# Patient Record
Sex: Female | Born: 1990 | Race: White | Hispanic: No | Marital: Single | Smoking: Never smoker
Health system: Southern US, Community
[De-identification: ages and names within clinical notes are randomized; demographics above are authoritative.]

## PROBLEM LIST (undated history)

## (undated) DIAGNOSIS — L309 Dermatitis, unspecified: Secondary | ICD-10-CM

## (undated) DIAGNOSIS — F419 Anxiety disorder, unspecified: Secondary | ICD-10-CM

## (undated) HISTORY — PX: OTHER SURGICAL HISTORY: SHX169

---

## 2017-02-05 ENCOUNTER — Other Ambulatory Visit: Payer: Self-pay | Admitting: Internal Medicine

## 2017-02-05 DIAGNOSIS — K8050 Calculus of bile duct without cholangitis or cholecystitis without obstruction: Secondary | ICD-10-CM

## 2017-02-06 ENCOUNTER — Ambulatory Visit
Admission: RE | Admit: 2017-02-06 | Discharge: 2017-02-06 | Disposition: A | Discharge status: Home / Self Care | Payer: BLUE CROSS/BLUE SHIELD | Source: Ambulatory Visit | Attending: Internal Medicine | Admitting: Internal Medicine

## 2017-02-06 DIAGNOSIS — K8050 Calculus of bile duct without cholangitis or cholecystitis without obstruction: Secondary | ICD-10-CM

## 2017-02-12 ENCOUNTER — Ambulatory Visit: Payer: Self-pay | Admitting: Surgery

## 2017-02-12 NOTE — H&P (Signed)
[facility]12[facility] 9:06 AM Location: [facility] Patient #: [id number] DOB: 12/06/1990 Undefined / Language: [place] / Race: White Female  History of Present Illness (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 No associated fevers or diarrhea. She still having some mild pain and nausea but is able to tolerate a bland diet. She underwent an ultrasound that did show gallstones. This was on June 22 at that time her gallbladder wall was thickened 4-5 mm with a positive sonographic Murphy's sign. There was no pericholecystic fluid and the common bile duct was 3 mm. She had a CMP, CBC and amylase all of which were unremarkable. She had exploratory surgery as a child but it sounds like it was examined under anesthesia she has no abdominal scars. She does not smoke, use alcohol or other drugs.  The patient is a 26 year old female.   Diagnostic Studies History Brandy Castillo([name], [place]; 02/12/2017 9:06 AM) Colonoscopy never Mammogram never Pap Smear >5 years ago  Allergies Brandy Castillo([name], RMA; 02/12/2017 9:08 AM) No Known Drug Allergies 02/12/2017  Medication History Brandy Castillo([name], RMA; 02/12/2017 9:08 AM) Claritin (10MG  Tablet, Oral) Active. Medications Reconciled  Social History Brandy Castillo([name], [place]; 02/12/2017 9:06 AM) Alcohol use Occasional alcohol use. Caffeine use Carbonated beverages, Coffee. No drug use Tobacco use Never smoker.  Family History Brandy Castillo([name], [place]; 02/12/2017 9:06 AM) Arthritis Father. Breast Cancer Family Members In General. Diabetes Mellitus Mother. Heart Disease Father. Hypertension Father. Melanoma Family Members In General. Migraine Headache Mother, Sister.  Pregnancy / Birth History Brandy Castillo(Christen  Lambert, [place]; 02/12/2017 9:06 AM) Age at menarche 12 years. Contraceptive History Depo-provera, Oral contraceptives. Gravida 0 Regular periods  Other Problems Brandy Castillo([name], RMA; 02/12/2017 9:06 AM) Cholelithiasis     Review of Systems ([name] MD; 02/12/2017 9:57 AM) General Present- Appetite Loss and Fatigue. Not Present- Chills, Fever, Night Sweats, Weight Gain and Weight Loss. Skin Not Present- Change in Wart/Mole, Dryness, Hives, Jaundice, New Lesions, Non-Healing Wounds, Rash and Ulcer. HEENT Present- Seasonal Allergies. Not Present- Earache, Hearing Loss, Hoarseness, Nose Bleed, Oral Ulcers, Ringing in the Ears, Sinus Pain, Sore Throat, Visual Disturbances, Wears glasses/contact lenses and Yellow Eyes. Respiratory Not Present- Bloody sputum, Chronic Cough, Difficulty Breathing, Snoring and Wheezing. Breast Not Present- Breast Mass, Breast Pain, Nipple Discharge and Skin Changes. Cardiovascular Not Present- Chest Pain, Difficulty Breathing Lying Down, Leg Cramps, Palpitations, Rapid Heart Rate, Shortness of Breath and Swelling of Extremities. Gastrointestinal Present- Abdominal Pain, Bloating, Change in Bowel Habits and Gets full quickly at meals. Not Present- Bloody Stool, Chronic diarrhea, Constipation, Difficulty Swallowing, Excessive gas, Hemorrhoids, Indigestion, Nausea, Rectal Pain and Vomiting. Female Genitourinary Not Present- Frequency, Nocturia, Painful Urination, Pelvic Pain and Urgency. Musculoskeletal Present- Back Pain. Not Present- Joint Pain, Joint Stiffness, Muscle Pain, Muscle Weakness and Swelling of Extremities. Neurological Not Present- Decreased Memory, Fainting, Headaches, Numbness, Seizures, Tingling, Tremor, Trouble walking and Weakness. Psychiatric Present- Change in Sleep Pattern. Not Present- Anxiety, Bipolar, Depression, Fearful and Frequent crying. Endocrine Not Present- Cold Intolerance, Excessive Hunger, Hair Changes, Heat Intolerance, Hot  flashes and New Diabetes. Hematology Not Present- Blood Thinners, Easy Bruising, Excessive bleeding, Gland problems, HIV and Persistent Infections. All other systems negative  Vitals Brandy Castillo([name] RMA; 02/12/2017 9:08 AM) 02/12/2017 9:07 AM Weight: 239.8 lb Height: 66.5in Body Surface Area: 2.17 m  Body Mass Index: 38.12 kg/m  Temp.: 98.46F  Pulse: 111 (Regular)  P.OX: 99% (Room air) BP: 158/100 (Sitting, Left Arm, Standard)      Physical Exam (Chelsea A. Connor MD; 02/12/2017 9:58 AM)  General Note: She is alert and oriented, no distress  Integumentary Note: Skin is warm and dry  Head and Neck Note: No mass or thyromegaly  Eye Note: Anicteric, extraocular motions intact, pupils equally round and reactive  Chest and Lung Exam Note: Unlabored respirations, symmetrical air entry. No tactile fremitus  Cardiovascular Note: Regular rate and rhythm. Palpable pedal pulses, no lower extremity edema.  Abdomen Note: Soft, nondistended. No palpable mass or organomegaly. Minimal tenderness in the right upper quadrant  Neurologic Note: Grossly intact, normal gait oriented 3  Neuropsychiatric Note: Normal mood and affect, appropriate insight  Musculoskeletal Note: Strength symmetrical throughout, no deformity    Assessment & Plan (Chelsea A. Connor MD; 02/12/2017 9:59 AM)  CHOLECYSTITIS (K81.9) Story: Normal labs, afebrile but inflammatory findings noted on ultrasound. I do recommend flaps And cholecystectomy. I discussed with her first of all symptoms that should prompt her to go to the ER including vomiting, fever, intractable pain and jaundice. I discussed with her laparoscopic surgery, very low risk of conversion to open, low risk of infection, bleeding, scarring, intra-abdominal injury specific lesion of the common bile duct and sequelae. She is already done some reading on her own and understood our conversation very well. She had several insightful  questions WERE answered. We'll try to get her scheduled as soon as possible.  Current Plans Started Hydrocodone-Acetaminophen 5-325MG , 1 (one) Tablet three times daily, as needed, #15, 5 days starting 02/12/2017, No Refill.

## 2017-02-16 ENCOUNTER — Encounter (HOSPITAL_COMMUNITY): Payer: Self-pay | Admitting: *Deleted

## 2017-02-16 NOTE — Progress Notes (Signed)
Ms Brandy Castillo denies chest pain or discomfort.  Patient reports that she has has palpations in the past , it was with anxiety- last time was in 2016.

## 2017-02-17 ENCOUNTER — Ambulatory Visit (HOSPITAL_COMMUNITY): Payer: BLUE CROSS/BLUE SHIELD | Admitting: Anesthesiology

## 2017-02-17 ENCOUNTER — Ambulatory Visit (HOSPITAL_COMMUNITY)
Admission: RE | Admit: 2017-02-17 | Discharge: 2017-02-17 | Disposition: A | Discharge status: Home / Self Care | Payer: BLUE CROSS/BLUE SHIELD | Source: Ambulatory Visit | Attending: Surgery | Admitting: Surgery

## 2017-02-17 ENCOUNTER — Encounter (HOSPITAL_COMMUNITY): Payer: Self-pay | Admitting: *Deleted

## 2017-02-17 ENCOUNTER — Encounter (HOSPITAL_COMMUNITY)
Admission: RE | Disposition: A | Discharge status: Home / Self Care | Payer: Self-pay | Source: Ambulatory Visit | Attending: Surgery

## 2017-02-17 DIAGNOSIS — Z6838 Body mass index (BMI) 38.0-38.9, adult: Secondary | ICD-10-CM | POA: Insufficient documentation

## 2017-02-17 DIAGNOSIS — K8000 Calculus of gallbladder with acute cholecystitis without obstruction: Secondary | ICD-10-CM | POA: Diagnosis not present

## 2017-02-17 DIAGNOSIS — K810 Acute cholecystitis: Secondary | ICD-10-CM | POA: Diagnosis present

## 2017-02-17 HISTORY — DX: Anxiety disorder, unspecified: F41.9

## 2017-02-17 HISTORY — DX: Dermatitis, unspecified: L30.9

## 2017-02-17 HISTORY — PX: CHOLECYSTECTOMY: SHX55

## 2017-02-17 LAB — CBC
HCT: 41.8 % (ref 36.0–46.0)
HEMOGLOBIN: 13.8 g/dL (ref 12.0–15.0)
MCH: 29.1 pg (ref 26.0–34.0)
MCHC: 33 g/dL (ref 30.0–36.0)
MCV: 88.2 fL (ref 78.0–100.0)
Platelets: 291 10*3/uL (ref 150–400)
RBC: 4.74 MIL/uL (ref 3.87–5.11)
RDW: 12.8 % (ref 11.5–15.5)
WBC: 4.2 10*3/uL (ref 4.0–10.5)

## 2017-02-17 LAB — HCG, SERUM, QUALITATIVE: PREG SERUM: NEGATIVE

## 2017-02-17 SURGERY — LAPAROSCOPIC CHOLECYSTECTOMY
Anesthesia: General | Site: Abdomen

## 2017-02-17 MED ORDER — ACETAMINOPHEN 500 MG PO TABS
ORAL_TABLET | ORAL | Status: AC
  Filled 2017-02-17: qty 2

## 2017-02-17 MED ORDER — 0.9 % SODIUM CHLORIDE (POUR BTL) OPTIME
TOPICAL | Status: DC | PRN
  Administered 2017-02-17: 1000 mL

## 2017-02-17 MED ORDER — MIDAZOLAM HCL 2 MG/2ML IJ SOLN
INTRAMUSCULAR | Status: AC
  Filled 2017-02-17: qty 2

## 2017-02-17 MED ORDER — DEXAMETHASONE SODIUM PHOSPHATE 10 MG/ML IJ SOLN
INTRAMUSCULAR | Status: DC | PRN
  Administered 2017-02-17: 10 mg via INTRAVENOUS

## 2017-02-17 MED ORDER — LACTATED RINGERS IV SOLN
INTRAVENOUS | Status: DC
  Administered 2017-02-17: 13:00:00 via INTRAVENOUS

## 2017-02-17 MED ORDER — BUPIVACAINE-EPINEPHRINE 0.25% -1:200000 IJ SOLN
INTRAMUSCULAR | Status: DC | PRN
  Administered 2017-02-17: 20 mL

## 2017-02-17 MED ORDER — CELECOXIB 200 MG PO CAPS
ORAL_CAPSULE | ORAL | Status: AC
  Filled 2017-02-17: qty 2

## 2017-02-17 MED ORDER — FENTANYL CITRATE (PF) 100 MCG/2ML IJ SOLN
INTRAMUSCULAR | Status: DC | PRN
  Administered 2017-02-17 (×2): 50 ug via INTRAVENOUS
  Administered 2017-02-17 (×3): 100 ug via INTRAVENOUS

## 2017-02-17 MED ORDER — SUGAMMADEX SODIUM 200 MG/2ML IV SOLN
INTRAVENOUS | Status: DC | PRN
  Administered 2017-02-17: 200 mg via INTRAVENOUS

## 2017-02-17 MED ORDER — CEFAZOLIN SODIUM-DEXTROSE 2-4 GM/100ML-% IV SOLN
INTRAVENOUS | Status: AC
  Filled 2017-02-17: qty 100

## 2017-02-17 MED ORDER — CEFAZOLIN SODIUM-DEXTROSE 2-4 GM/100ML-% IV SOLN
2.0000 g | INTRAVENOUS | Status: AC
  Administered 2017-02-17: 2 g via INTRAVENOUS

## 2017-02-17 MED ORDER — FENTANYL CITRATE (PF) 100 MCG/2ML IJ SOLN
25.0000 ug | INTRAMUSCULAR | Status: DC | PRN
  Administered 2017-02-17 (×2): 50 ug via INTRAVENOUS

## 2017-02-17 MED ORDER — ONDANSETRON HCL 4 MG/2ML IJ SOLN
INTRAMUSCULAR | Status: DC | PRN
  Administered 2017-02-17: 4 mg via INTRAVENOUS

## 2017-02-17 MED ORDER — CHLORHEXIDINE GLUCONATE 4 % EX LIQD: 60.0000 mL | Freq: Once | CUTANEOUS | Status: DC

## 2017-02-17 MED ORDER — ROCURONIUM BROMIDE 100 MG/10ML IV SOLN
INTRAVENOUS | Status: DC | PRN
  Administered 2017-02-17: 60 mg via INTRAVENOUS

## 2017-02-17 MED ORDER — FENTANYL CITRATE (PF) 250 MCG/5ML IJ SOLN
INTRAMUSCULAR | Status: AC
  Filled 2017-02-17: qty 5

## 2017-02-17 MED ORDER — LACTATED RINGERS IV SOLN
INTRAVENOUS | Status: DC | PRN
  Administered 2017-02-17 (×2): via INTRAVENOUS

## 2017-02-17 MED ORDER — DOCUSATE SODIUM 100 MG PO CAPS: 100.0000 mg | ORAL_CAPSULE | Freq: Two times a day (BID) | ORAL | 0 refills | Status: AC

## 2017-02-17 MED ORDER — ACETAMINOPHEN 500 MG PO TABS
1000.0000 mg | ORAL_TABLET | ORAL | Status: AC
  Administered 2017-02-17: 1000 mg via ORAL

## 2017-02-17 MED ORDER — MIDAZOLAM HCL 5 MG/5ML IJ SOLN
INTRAMUSCULAR | Status: DC | PRN
  Administered 2017-02-17: 2 mg via INTRAVENOUS

## 2017-02-17 MED ORDER — OXYCODONE HCL 5 MG PO TABS: 5.0000 mg | ORAL_TABLET | Freq: Once | ORAL | Status: DC | PRN

## 2017-02-17 MED ORDER — BUPIVACAINE-EPINEPHRINE (PF) 0.25% -1:200000 IJ SOLN
INTRAMUSCULAR | Status: AC
  Filled 2017-02-17: qty 30

## 2017-02-17 MED ORDER — FENTANYL CITRATE (PF) 100 MCG/2ML IJ SOLN
INTRAMUSCULAR | Status: AC
  Administered 2017-02-17: 50 ug via INTRAVENOUS
  Filled 2017-02-17: qty 2

## 2017-02-17 MED ORDER — OXYCODONE HCL 5 MG PO TABS: 5.0000 mg | ORAL_TABLET | ORAL | Status: DC | PRN

## 2017-02-17 MED ORDER — OXYCODONE-ACETAMINOPHEN 5-325 MG PO TABS: 1.0000 | ORAL_TABLET | Freq: Four times a day (QID) | ORAL | 0 refills | Status: AC | PRN

## 2017-02-17 MED ORDER — GABAPENTIN 300 MG PO CAPS
300.0000 mg | ORAL_CAPSULE | ORAL | Status: AC
  Administered 2017-02-17: 300 mg via ORAL

## 2017-02-17 MED ORDER — PROPOFOL 10 MG/ML IV BOLUS
INTRAVENOUS | Status: AC
  Filled 2017-02-17: qty 20

## 2017-02-17 MED ORDER — OXYCODONE HCL 5 MG/5ML PO SOLN: 5.0000 mg | Freq: Once | ORAL | Status: DC | PRN

## 2017-02-17 MED ORDER — PROPOFOL 10 MG/ML IV BOLUS
INTRAVENOUS | Status: DC | PRN
  Administered 2017-02-17: 30 mg via INTRAVENOUS
  Administered 2017-02-17: 100 mg via INTRAVENOUS
  Administered 2017-02-17: 130 mg via INTRAVENOUS
  Administered 2017-02-17: 40 mg via INTRAVENOUS

## 2017-02-17 MED ORDER — CELECOXIB 200 MG PO CAPS
400.0000 mg | ORAL_CAPSULE | ORAL | Status: AC
  Administered 2017-02-17: 400 mg via ORAL

## 2017-02-17 MED ORDER — SODIUM CHLORIDE 0.9 % IR SOLN
Status: DC | PRN
  Administered 2017-02-17: 1

## 2017-02-17 MED ORDER — GABAPENTIN 300 MG PO CAPS
ORAL_CAPSULE | ORAL | Status: AC
  Filled 2017-02-17: qty 1

## 2017-02-17 SURGICAL SUPPLY — 38 items
APPLIER CLIP 5 13 M/L LIGAMAX5 (MISCELLANEOUS) ×3
BLADE CLIPPER SURG (BLADE) IMPLANT
CANISTER SUCT 3000ML PPV (MISCELLANEOUS) ×3 IMPLANT
CHLORAPREP W/TINT 26ML (MISCELLANEOUS) ×3 IMPLANT
CLIP APPLIE 5 13 M/L LIGAMAX5 (MISCELLANEOUS) ×1 IMPLANT
COVER SURGICAL LIGHT HANDLE (MISCELLANEOUS) ×3 IMPLANT
DERMABOND ADVANCED (GAUZE/BANDAGES/DRESSINGS) ×2
DERMABOND ADVANCED .7 DNX12 (GAUZE/BANDAGES/DRESSINGS) ×1 IMPLANT
ELECT REM PT RETURN 9FT ADLT (ELECTROSURGICAL) ×3
ELECTRODE REM PT RTRN 9FT ADLT (ELECTROSURGICAL) ×1 IMPLANT
GLOVE BIO SURGEON STRL SZ 6 (GLOVE) ×3 IMPLANT
GLOVE BIOGEL PI IND STRL 6 (GLOVE) ×1 IMPLANT
GLOVE BIOGEL PI IND STRL 6.5 (GLOVE) ×2 IMPLANT
GLOVE BIOGEL PI IND STRL 7.0 (GLOVE) ×2 IMPLANT
GLOVE BIOGEL PI INDICATOR 6 (GLOVE) ×2
GLOVE BIOGEL PI INDICATOR 6.5 (GLOVE) ×4
GLOVE BIOGEL PI INDICATOR 7.0 (GLOVE) ×4
GLOVE SURG SS PI 7.0 STRL IVOR (GLOVE) ×3 IMPLANT
GOWN STRL REUS W/ TWL LRG LVL3 (GOWN DISPOSABLE) ×3 IMPLANT
GOWN STRL REUS W/TWL LRG LVL3 (GOWN DISPOSABLE) ×6
GRASPER SUT TROCAR 14GX15 (MISCELLANEOUS) ×3 IMPLANT
KIT BASIN OR (CUSTOM PROCEDURE TRAY) ×3 IMPLANT
KIT ROOM TURNOVER OR (KITS) ×3 IMPLANT
NEEDLE INSUFFLATION 14GA 120MM (NEEDLE) ×3 IMPLANT
NS IRRIG 1000ML POUR BTL (IV SOLUTION) ×3 IMPLANT
PAD ARMBOARD 7.5X6 YLW CONV (MISCELLANEOUS) ×3 IMPLANT
POUCH SPECIMEN RETRIEVAL 10MM (ENDOMECHANICALS) ×3 IMPLANT
SCISSORS LAP 5X35 DISP (ENDOMECHANICALS) ×3 IMPLANT
SET IRRIG TUBING LAPAROSCOPIC (IRRIGATION / IRRIGATOR) ×3 IMPLANT
SLEEVE ENDOPATH XCEL 5M (ENDOMECHANICALS) ×6 IMPLANT
SPECIMEN JAR SMALL (MISCELLANEOUS) ×3 IMPLANT
SUT MNCRL AB 4-0 PS2 18 (SUTURE) ×3 IMPLANT
TOWEL OR 17X24 6PK STRL BLUE (TOWEL DISPOSABLE) ×3 IMPLANT
TOWEL OR 17X26 10 PK STRL BLUE (TOWEL DISPOSABLE) IMPLANT
TRAY LAPAROSCOPIC MC (CUSTOM PROCEDURE TRAY) ×3 IMPLANT
TROCAR XCEL NON-BLD 11X100MML (ENDOMECHANICALS) ×3 IMPLANT
TROCAR XCEL NON-BLD 5MMX100MML (ENDOMECHANICALS) ×3 IMPLANT
TUBING INSUFFLATION (TUBING) ×3 IMPLANT

## 2017-02-17 NOTE — Interval H&P Note (Signed)
History and Physical Interval Note:  02/17/2017 12:48 PM  Brandy Castillo  has presented today for surgery, with the diagnosis of cholecystitis  The various methods of treatment have been discussed with the patient and family. After consideration of risks, benefits and other options for treatment, the patient has consented to  Procedure(s): LAPAROSCOPIC CHOLECYSTECTOMY (N/A) as a surgical intervention .  The patient's history has been reviewed, patient examined, no change in status, stable for surgery.  I have reviewed the patient's chart and labs.  Questions were answered to the patient's satisfaction.     Yuvia Plant Lollie SailsA Cary Wilford

## 2017-02-17 NOTE — Discharge Instructions (Signed)
LAPAROSCOPIC SURGERY: POST OP INSTRUCTIONS ° °###################################################################### ° °EAT °Gradually transition to a high fiber diet with a fiber supplement over the next few weeks after discharge.  Start with a pureed / full liquid diet (see below) ° °WALK °Walk an hour a day.  Control your pain to do that.   ° °CONTROL PAIN °Control pain so that you can walk, sleep, tolerate sneezing/coughing, go up/down stairs. ° °HAVE A BOWEL MOVEMENT DAILY °Keep your bowels regular to avoid problems.  OK to try a laxative to override constipation.  OK to use an antidairrheal to slow down diarrhea.  Call if not better after 2 tries ° °CALL IF YOU HAVE PROBLEMS/CONCERNS °Call if you are still struggling despite following these instructions. °Call if you have concerns not answered by these instructions ° °###################################################################### ° ° ° °1. DIET: Follow a light bland diet the first 24 hours after arrival home, such as soup, liquids, crackers, etc.  Be sure to include lots of fluids daily.  Avoid fast food or heavy meals as your are more likely to get nauseated.  Eat a low fat the next few days after surgery.   °2. Take your usually prescribed home medications unless otherwise directed. °3. PAIN CONTROL: °a. Pain is best controlled by a usual combination of three different methods TOGETHER: °i. Ice/Heat °ii. Over the counter pain medication °iii. Prescription pain medication °b. Most patients will experience some swelling and bruising around the incisions.  Ice packs or heating pads (30-60 minutes up to 6 times a day) will help. Use ice for the first few days to help decrease swelling and bruising, then switch to heat to help relax tight/sore spots and speed recovery.  Some people prefer to use ice alone, heat alone, alternating between ice & heat.  Experiment to what works for you.  Swelling and bruising can take several weeks to resolve.   °c. It is  helpful to take an over-the-counter pain medication regularly for the first few weeks.  Choose one of the following that works best for you: °i. Naproxen (Aleve, etc)  Two 220mg tabs twice a day °ii. Ibuprofen (Advil, etc) Three 200mg tabs four times a day (every meal & bedtime) °iii. Acetaminophen (Tylenol, etc) 500-650mg four times a day (every meal & bedtime) °d. A  prescription for pain medication (such as oxycodone, hydrocodone, etc) should be given to you upon discharge.  Take your pain medication as prescribed.  °i. If you are having problems/concerns with the prescription medicine (does not control pain, nausea, vomiting, rash, itching, etc), please call us [phone] to see if we need to switch you to a different pain medicine that will work better for you and/or control your side effect better. °ii. If you need a refill on your pain medication, please contact your pharmacy.  They will contact our office to request authorization. Prescriptions will not be filled after 5 pm or on week-ends. °4. Avoid getting constipated.  Between the surgery and the pain medications, it is common to experience some constipation.  Increasing fluid intake and taking a fiber supplement (such as Metamucil, Citrucel, FiberCon, MiraLax, etc) 1-2 times a day regularly will usually help prevent this problem from occurring.  A mild laxative (prune juice, Milk of Magnesia, MiraLax, etc) should be taken according to package directions if there are no bowel movements after 48 hours.   °5. Watch out for diarrhea.  If you have many loose bowel movements, simplify your diet to bland foods & liquids for   a few days.  Stop any stool softeners and decrease your fiber supplement.  Switching to mild anti-diarrheal medications (Kayopectate, Pepto Bismol) can help.  If this worsens or does not improve, please call us. °6. Wash / shower every day.  You may shower over the skin glue which is waterproof.  Continue to shower over incision(s) after  the dressing is off. °7. Skin glue will flake off after about 2 weeks.  You may leave the incision open to air.  You may replace a dressing/Band-Aid to cover the incision for comfort if you wish.  °8. ACTIVITIES as tolerated:   °a. You may resume regular (light) daily activities beginning the next day--such as daily self-care, walking, climbing stairs--gradually increasing activities as tolerated.  If you can walk 30 minutes without difficulty, it is safe to try more intense activity such as jogging, treadmill, bicycling, low-impact aerobics, swimming, etc. °b. Save the most intensive and strenuous activity for last such as sit-ups, heavy lifting, contact sports, etc  Refrain from any heavy lifting or straining until you are off narcotics for pain control.   °c. DO NOT PUSH THROUGH PAIN.  Let pain be your guide: If it hurts to do something, don't do it.  Pain is your body warning you to avoid that activity for another week until the pain goes down. °d. You may drive when you are no longer taking prescription pain medication, you can comfortably wear a seatbelt, and you can safely maneuver your car and apply brakes. °e. You may have sexual intercourse when it is comfortable.  °9. FOLLOW UP in our office °a. Please call [facility] at [phone] to set up an appointment to see your surgeon in the office for a follow-up appointment approximately 2-3 weeks after your surgery. °b. Make sure that you call for this appointment the day you arrive home to insure a convenient appointment time. °10. IF YOU HAVE DISABILITY OR FAMILY LEAVE FORMS, BRING THEM TO THE OFFICE FOR PROCESSING.  DO NOT GIVE THEM TO YOUR DOCTOR. ° ° °WHEN TO CALL US [phone]: °1. Poor pain control °2. Reactions / problems with new medications (rash/itching, nausea, etc)  °3. Fever over 101.5 F (38.5 C) °4. Inability to urinate °5. Nausea and/or vomiting °6. Worsening swelling or bruising °7. Continued bleeding from incision. °8. Increased pain,  redness, or drainage from the incision ° ° The clinic staff is available to answer your questions during regular business hours (8:30am-5pm).  Please don’t hesitate to call and ask to speak to one of our nurses for clinical concerns.  ° If you have a medical emergency, go to the nearest emergency room or call 911. ° A surgeon from [facility] is always on call at the hospitals ° ° °[facility] °[address], [address], [place], [place]  [address] ? °MAIN: [phone] ? TOLL FREE: [phone] ?  °FAX [phone] °[email or web address] ° ° °

## 2017-02-17 NOTE — Anesthesia Preprocedure Evaluation (Signed)
Anesthesia Evaluation  Patient identified by MRN, date of birth, ID band Patient awake    Reviewed: Allergy & Precautions, NPO status , Patient's Chart, lab work & pertinent test results  Airway Mallampati: II  TM Distance: >3 FB Neck ROM: Full    Dental  (+) Teeth Intact   Pulmonary neg pulmonary ROS,    breath sounds clear to auscultation       Cardiovascular negative cardio ROS   Rhythm:Regular     Neuro/Psych negative neurological ROS  negative psych ROS   GI/Hepatic Neg liver ROS, Gall stones   Endo/Other  Morbid obesity  Renal/GU negative Renal ROS     Musculoskeletal negative musculoskeletal ROS (+)   Abdominal   Peds  Hematology negative hematology ROS (+)   Anesthesia Other Findings   Reproductive/Obstetrics                             Anesthesia Physical Anesthesia Plan  ASA: II  Anesthesia Plan: General   Post-op Pain Management:    Induction: Intravenous  PONV Risk Score and Plan: 3 and Ondansetron, Dexamethasone, Propofol and Midazolam  Airway Management Planned: Oral ETT  Additional Equipment: None  Intra-op Plan:   Post-operative Plan: Extubation in OR  Informed Consent: I have reviewed the patients History and Physical, chart, labs and discussed the procedure including the risks, benefits and alternatives for the proposed anesthesia with the patient or authorized representative who has indicated his/her understanding and acceptance.   Dental advisory given  Plan Discussed with: CRNA and Surgeon  Anesthesia Plan Comments:         Anesthesia Quick Evaluation

## 2017-02-17 NOTE — H&P (View-Only) (Signed)
Brandy Castillo 02/12/2017 9:06 AM Location: [facility] Patient #: [id number] DOB: 12/06/1990 Undefined / Language: [place] / Race: White Female  History of Present Illness (Margene Cherian A. 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 No associated fevers or diarrhea. She still having some mild pain and nausea but is able to tolerate a bland diet. She underwent an ultrasound that did show gallstones. This was on June 22 at that time her gallbladder wall was thickened 4-5 mm with a positive sonographic Murphy's sign. There was no pericholecystic fluid and the common bile duct was 3 mm. She had a CMP, CBC and amylase all of which were unremarkable. She had exploratory surgery as a child but it sounds like it was examined under anesthesia she has no abdominal scars. She does not smoke, use alcohol or other drugs.  The patient is a 26 year old female.   Diagnostic Studies History [name]([name], [place]; 02/12/2017 9:06 AM) Colonoscopy never Mammogram never Pap Smear >5 years ago  Allergies [name]([name], RMA; 02/12/2017 9:08 AM) No Known Drug Allergies 02/12/2017  Medication History [name]([name], RMA; 02/12/2017 9:08 AM) Claritin (10MG  Tablet, Oral) Active. Medications Reconciled  Social History [name]([name], [place]; 02/12/2017 9:06 AM) Alcohol use Occasional alcohol use. Caffeine use Carbonated beverages, Coffee. No drug use Tobacco use Never smoker.  Family History [name]([name], [place]; 02/12/2017 9:06 AM) Arthritis Father. Breast Cancer Family Members In General. Diabetes Mellitus Mother. Heart Disease Father. Hypertension Father. Melanoma Family Members In General. Migraine Headache Mother, Sister.  Pregnancy / Birth History [name](Christen  Lambert, [place]; 02/12/2017 9:06 AM) Age at menarche 12 years. Contraceptive History Depo-provera, Oral contraceptives. Gravida 0 Regular periods  Other Problems [name]([name], RMA; 02/12/2017 9:06 AM) Cholelithiasis     Review of Systems (Raunak Antuna A. Fredricka Bonineonnor MD; 02/12/2017 9:57 AM) General Present- Appetite Loss and Fatigue. Not Present- Chills, Fever, Night Sweats, Weight Gain and Weight Loss. Skin Not Present- Change in Wart/Mole, Dryness, Hives, Jaundice, New Lesions, Non-Healing Wounds, Rash and Ulcer. HEENT Present- Seasonal Allergies. Not Present- Earache, Hearing Loss, Hoarseness, Nose Bleed, Oral Ulcers, Ringing in the Ears, Sinus Pain, Sore Throat, Visual Disturbances, Wears glasses/contact lenses and Yellow Eyes. Respiratory Not Present- Bloody sputum, Chronic Cough, Difficulty Breathing, Snoring and Wheezing. Breast Not Present- Breast Mass, Breast Pain, Nipple Discharge and Skin Changes. Cardiovascular Not Present- Chest Pain, Difficulty Breathing Lying Down, Leg Cramps, Palpitations, Rapid Heart Rate, Shortness of Breath and Swelling of Extremities. Gastrointestinal Present- Abdominal Pain, Bloating, Change in Bowel Habits and Gets full quickly at meals. Not Present- Bloody Stool, Chronic diarrhea, Constipation, Difficulty Swallowing, Excessive gas, Hemorrhoids, Indigestion, Nausea, Rectal Pain and Vomiting. Female Genitourinary Not Present- Frequency, Nocturia, Painful Urination, Pelvic Pain and Urgency. Musculoskeletal Present- Back Pain. Not Present- Joint Pain, Joint Stiffness, Muscle Pain, Muscle Weakness and Swelling of Extremities. Neurological Not Present- Decreased Memory, Fainting, Headaches, Numbness, Seizures, Tingling, Tremor, Trouble walking and Weakness. Psychiatric Present- Change in Sleep Pattern. Not Present- Anxiety, Bipolar, Depression, Fearful and Frequent crying. Endocrine Not Present- Cold Intolerance, Excessive Hunger, Hair Changes, Heat Intolerance, Hot  flashes and New Diabetes. Hematology Not Present- Blood Thinners, Easy Bruising, Excessive bleeding, Gland problems, HIV and Persistent Infections. All other systems negative  Vitals [name]([name] RMA; 02/12/2017 9:08 AM) 02/12/2017 9:07 AM Weight: 239.8 lb Height: 66.5in Body Surface Area: 2.17 m  Body Mass Index: 38.12 kg/m  Temp.: 98.46F  Pulse: 111 (Regular)  P.OX: 99% (Room air) BP: 158/100 (Sitting, Left Arm, Standard)      Physical Exam (Kyre Jeffries A. Laterrian Hevener MD; 02/12/2017 9:58 AM)  General Note: She is alert and oriented, no distress  Integumentary Note: Skin is warm and dry  Head and Neck Note: No mass or thyromegaly  Eye Note: Anicteric, extraocular motions intact, pupils equally round and reactive  Chest and Lung Exam Note: Unlabored respirations, symmetrical air entry. No tactile fremitus  Cardiovascular Note: Regular rate and rhythm. Palpable pedal pulses, no lower extremity edema.  Abdomen Note: Soft, nondistended. No palpable mass or organomegaly. Minimal tenderness in the right upper quadrant  Neurologic Note: Grossly intact, normal gait oriented 3  Neuropsychiatric Note: Normal mood and affect, appropriate insight  Musculoskeletal Note: Strength symmetrical throughout, no deformity    Assessment & Plan (Taven Strite A. Deamber Buckhalter MD; 02/12/2017 9:59 AM)  CHOLECYSTITIS (K81.9) Story: Normal labs, afebrile but inflammatory findings noted on ultrasound. I do recommend flaps And cholecystectomy. I discussed with her first of all symptoms that should prompt her to go to the ER including vomiting, fever, intractable pain and jaundice. I discussed with her laparoscopic surgery, very low risk of conversion to open, low risk of infection, bleeding, scarring, intra-abdominal injury specific lesion of the common bile duct and sequelae. She is already done some reading on her own and understood our conversation very well. She had several insightful  questions WERE answered. We'll try to get her scheduled as soon as possible.  Current Plans Started Hydrocodone-Acetaminophen 5-325MG , 1 (one) Tablet three times daily, as needed, #15, 5 days starting 02/12/2017, No Refill.

## 2017-02-17 NOTE — Transfer of Care (Signed)
Immediate Anesthesia Transfer of Care Note  Patient: Brandy Castillo  Procedure(s) Performed: Procedure(s): LAPAROSCOPIC CHOLECYSTECTOMY (N/A)  Patient Location: PACU  Anesthesia Type:General  Level of Consciousness: awake  Airway & Oxygen Therapy: Patient Spontanous Breathing  Post-op Assessment: Report given to RN and Post -op Vital signs reviewed and stable  Post vital signs: Reviewed and stable  Last Vitals:  Vitals:   02/17/17 1245 02/17/17 1609  BP: (!) 145/87 (!) 144/86  Pulse: 89 (!) 110  Resp: 18 19  Temp: 36.8 C 36.8 C    Last Pain:  Vitals:   02/17/17 1609  TempSrc:   PainSc: 5       Patients Stated Pain Goal: 2 (02/17/17 1242)  Complications: No apparent anesthesia complications

## 2017-02-17 NOTE — Anesthesia Procedure Notes (Signed)
Procedure Name: Intubation Date/Time: 02/17/2017 2:31 PM Performed by: Manus Gunning, Xoey Warmoth J Pre-anesthesia Checklist: Patient identified, Emergency Drugs available, Suction available, Patient being monitored and Timeout performed Patient Re-evaluated:Patient Re-evaluated prior to inductionOxygen Delivery Method: Circle system utilized Preoxygenation: Pre-oxygenation with 100% oxygen Intubation Type: IV induction Ventilation: Mask ventilation without difficulty Laryngoscope Size: Mac and 3 Grade View: Grade II Tube type: Oral Tube size: 7.0 mm Number of attempts: 1 Airway Equipment and Method: Stylet Placement Confirmation: ETT inserted through vocal cords under direct vision,  positive ETCO2 and breath sounds checked- equal and bilateral Secured at: 21 cm Tube secured with: Tape Dental Injury: Teeth and Oropharynx as per pre-operative assessment

## 2017-02-17 NOTE — Op Note (Signed)
Operative Note  Brandy Castillo 26 y.o. female [id number]  02/17/2017  Surgeon: Berna Buehelsea A Minard Millirons   Assistant: OR staff  Procedure performed: Laparoscopic Cholecystectomy  Preop diagnosis: acute cholecystitis Post-op diagnosis/intraop findings: same  Specimens: gallbladder  EBL: 50cc  Complications: none  Description of procedure: After obtaining informed consent the patient was brought to the operating room. Prophylactic antibiotics and subcutaneous heparin were administered. SCD's were applied. General endotracheal anesthesia was initiated and a formal time-out was performed. The abdomen was prepped and draped in the usual sterile fashion and the abdomen was entered using an infraumbilical veress needle after instilling the site with local. Insufflation to 15mmHg was obtained, 5mm trocar and camera placed, and gross inspection revealed no evidence of injury from our entry or other intraabdominal abnormalities. Twi 5mm trocars were introduced in the right midclavicular and right anterior axillary lines under direct visualization and following infiltration with local. An 11mm trocar was placed in the epigastrium. The gallbladder was distended, edematous and very erythematous and encased in omental adhesions. Once these had been carefully taken down and the gallbladder aspirated with the nezhat, the gallbladder was retracted cephalad and the infundibulum was retracted laterally. A combination of hook electrocautery and blunt dissection was utilized to clear the peritoneum from the neck and cystic duct, circumferentially isolating the cystic artery and cystic duct and lifting the gallbladder from the cystic plate. The critical view of safety was achieved with the cystic artery, cystic duct, and liver bed visualized between them with no other structures. The common duct also was visible medially. The artery was clipped with a single clip proximally and distally and divided as was the cystic duct with two  clips on the proximal end. The gallbladder was dissected from the liver plate using electrocautery. Once freed the gallbladder was placed in an endocatch bag and removed through the epigastric trocar site. A small amount of bleeding on the liver bed was controlled with cautery. The right upper quadrant was aspirated and the right upper quadrant was irrigated copiously until the effluent was clear. Hemostasis was once again confirmed, and reinspection of the abdomen revealed no injuries. The clips were well opposed without any bile leak from the duct or the liver bed. The 11mm trocar site in the epigastrium was closed with a 0 vicryl in the fascia under direct visualization using a PMI device. The abdomen was desufflated and all trocars removed. The skin incisions were closed with running subcuticular monocryl and Dermabond. The patient was awakened, extubated and transported to the recovery room in stable condition.   All counts were correct at the completion of the case.

## 2017-02-18 ENCOUNTER — Encounter (HOSPITAL_COMMUNITY): Payer: Self-pay | Admitting: Surgery

## 2017-02-20 NOTE — Anesthesia Postprocedure Evaluation (Signed)
Anesthesia Post Note  Patient: Brandy Castillo  Procedure(s) Performed: Procedure(s) (LRB): LAPAROSCOPIC CHOLECYSTECTOMY (N/A)     Patient location during evaluation: PACU Anesthesia Type: General Level of consciousness: awake and alert Pain management: pain level controlled Vital Signs Assessment: post-procedure vital signs reviewed and stable Respiratory status: spontaneous breathing, nonlabored ventilation, respiratory function stable and patient connected to nasal cannula oxygen Cardiovascular status: blood pressure returned to baseline and stable Postop Assessment: no signs of nausea or vomiting Anesthetic complications: no    Last Vitals:  Vitals:   02/17/17 1654 02/17/17 1709  BP: 124/74 134/84  Pulse: 87 86  Resp: 17 16  Temp:      Last Pain:  Vitals:   02/17/17 1709  TempSrc:   PainSc: 4                  Kao Berkheimer

## 2018-12-11 IMAGING — US US ABDOMEN LIMITED
1 series · 14 of 25 positions shown · non-contrast
Comparison: None.

CLINICAL DATA: Biliary colic with right upper quadrant pain for 5
days. Vomiting after eating.

EXAM:
ULTRASOUND ABDOMEN LIMITED RIGHT UPPER QUADRANT

[Series 1: us abdomen limited · 0.23mm/px · 14 of 40 slices shown]
[im 1/40]
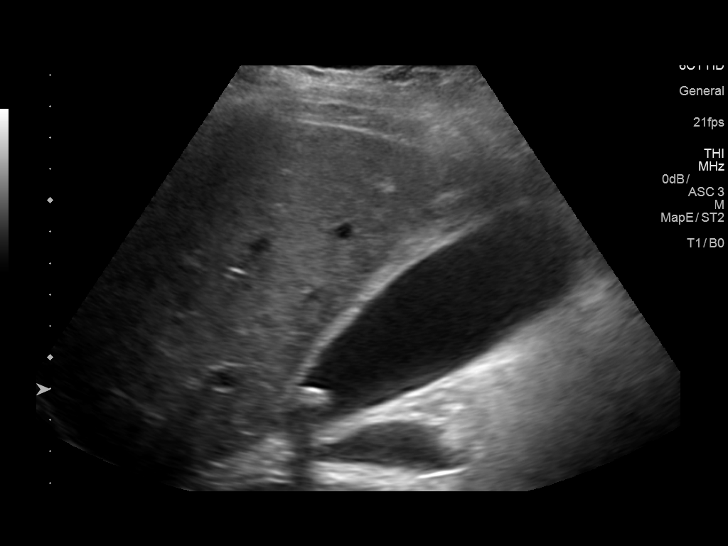
[im 4/40]
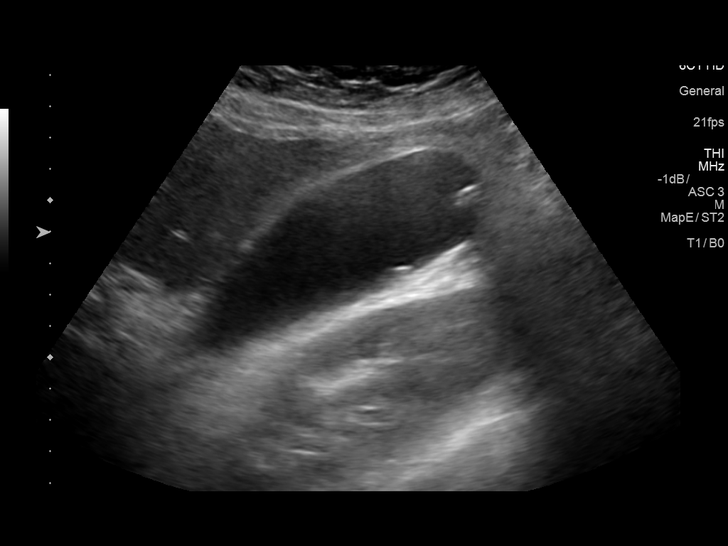
[im 7/40]
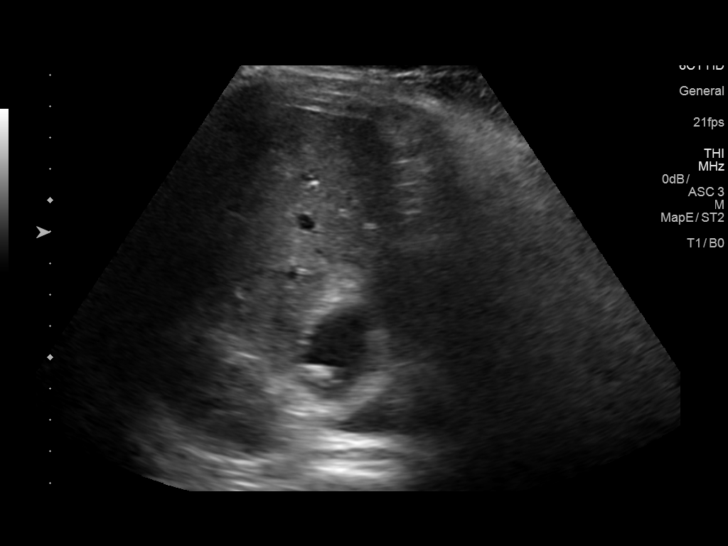
[im 10/40]
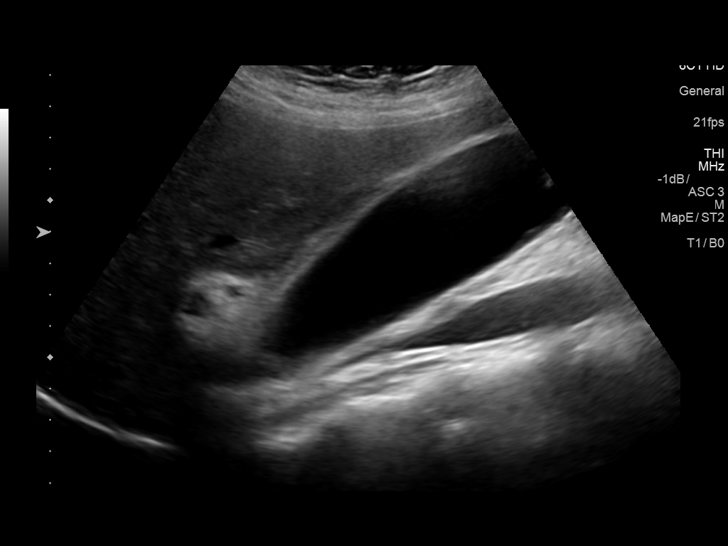
[im 14/40]
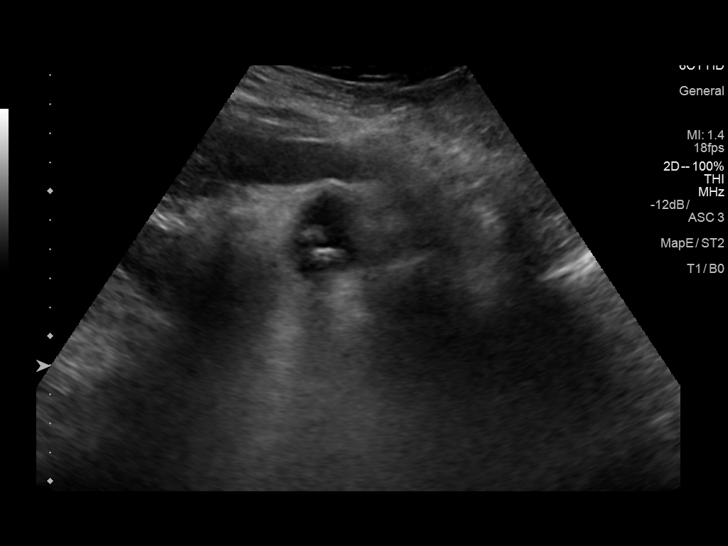
[im 15/40]
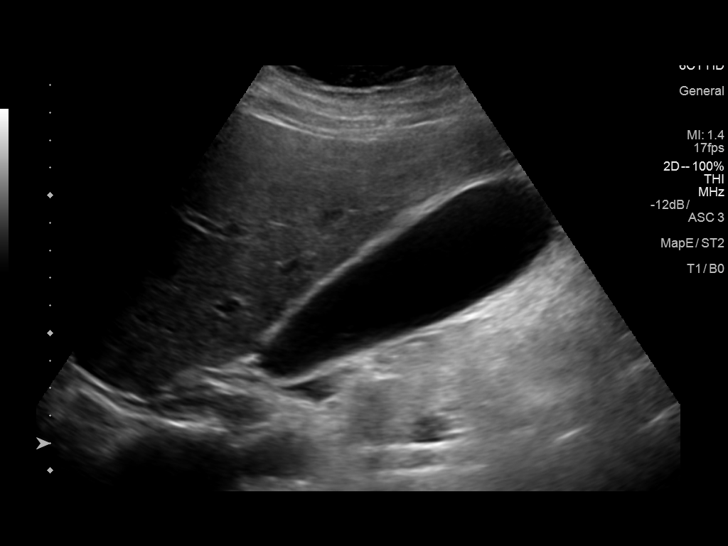
[im 18/40]
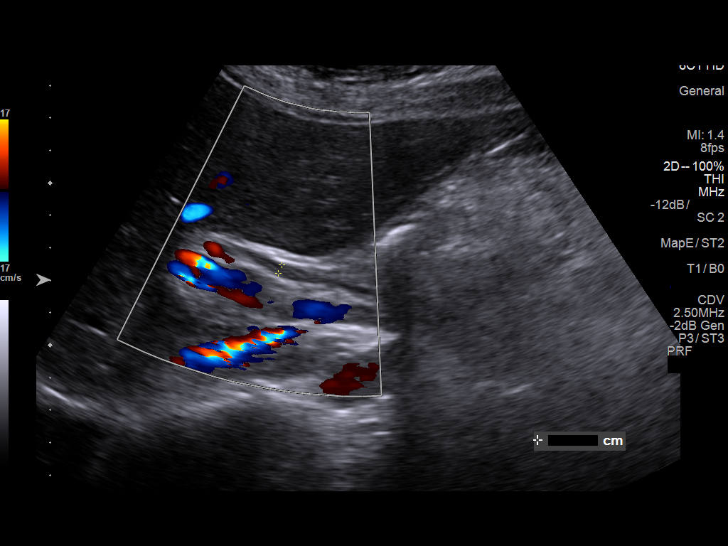
[im 22/40]
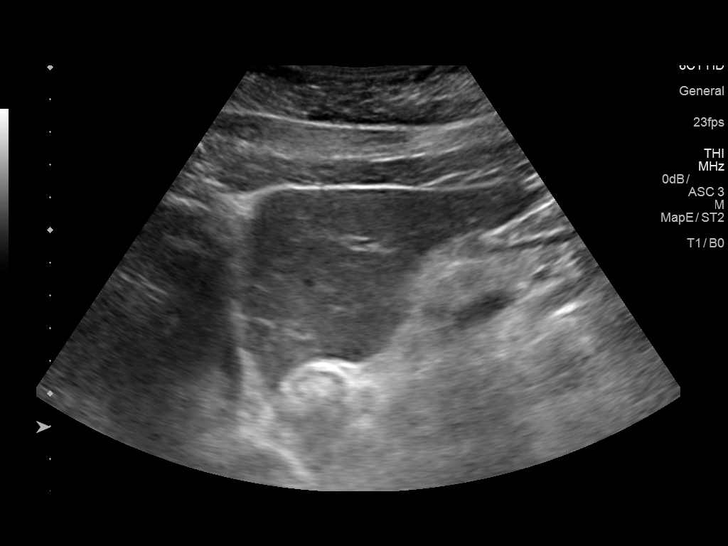
[im 25/40]
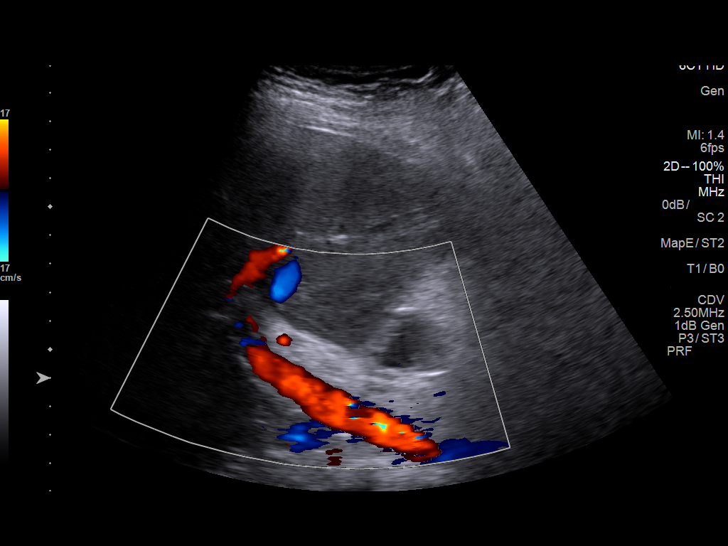
[im 27/40]
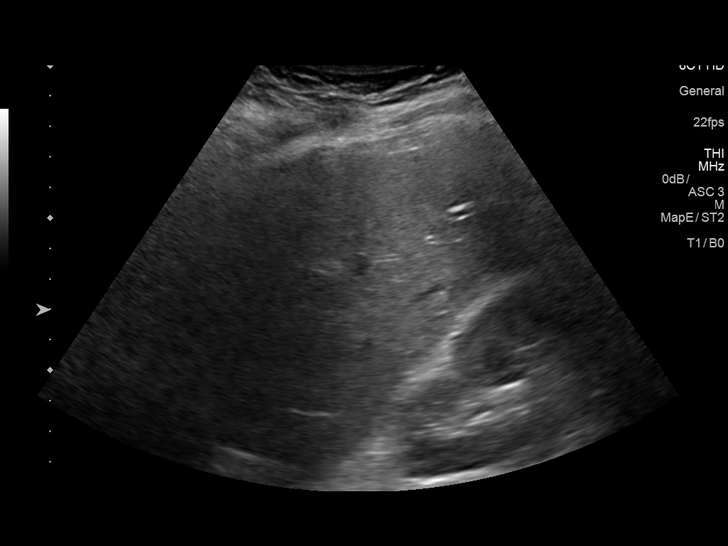
[im 30/40]
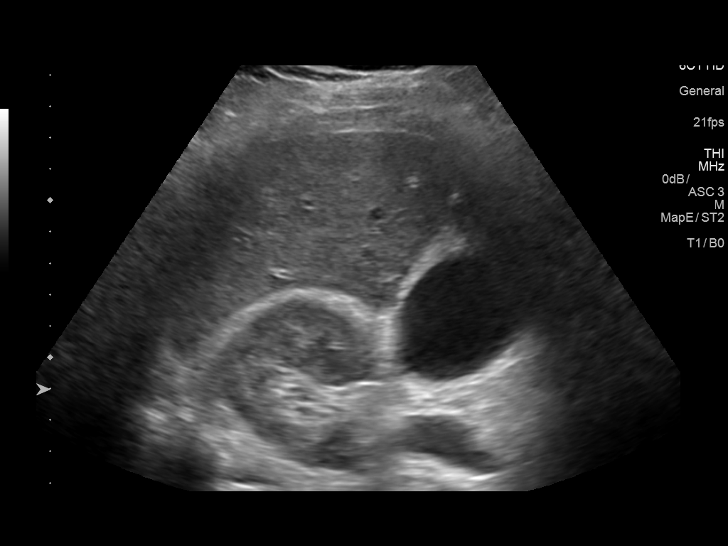
[im 33/40]
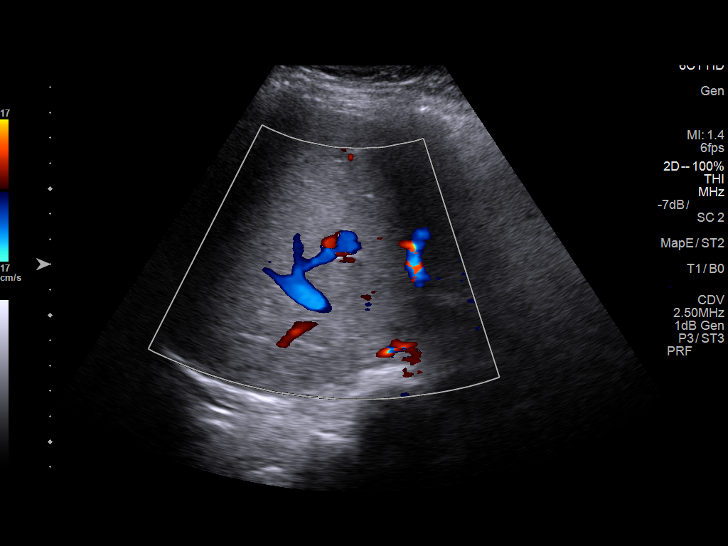
[im 36/40]
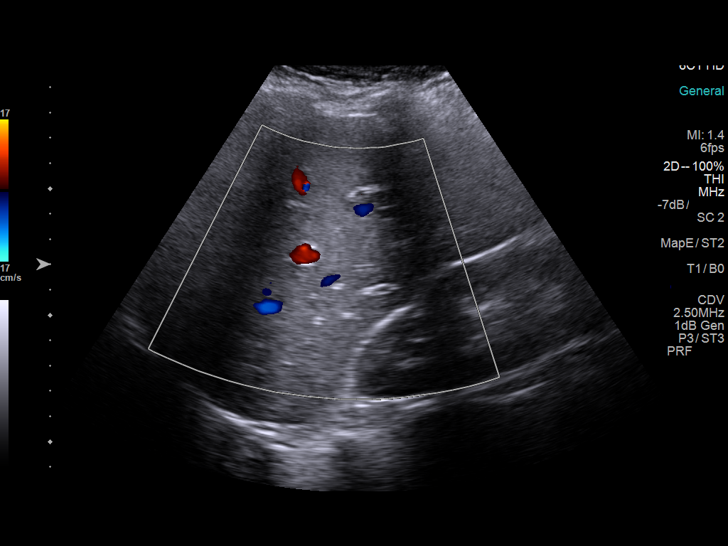
[im 40/40]
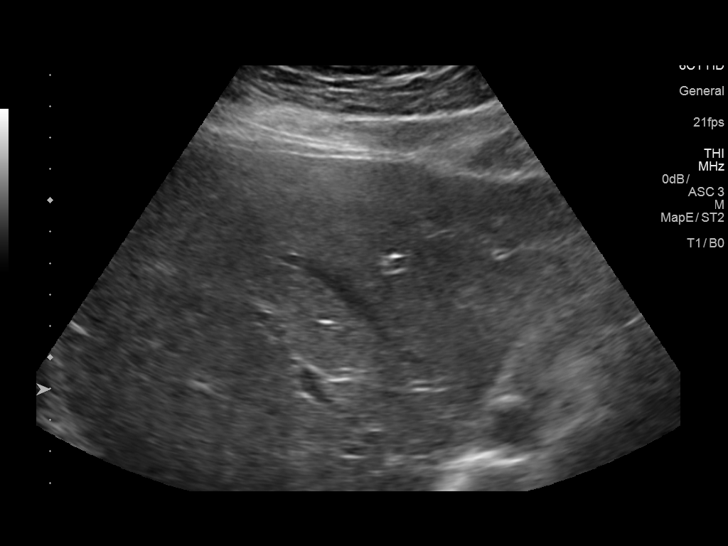

[14 of 25 positions shown; findings below may reference images not displayed]

FINDINGS: Gallbladder:

Gallstones are evident, measuring up to 14 mm. Gallbladder wall
mildly thickened at 4- 5 mm. No pericholecystic fluid. The
sonographer reports a positive sonographic Murphy sign.

Common bile duct:

Diameter: 3 mm

Liver:

Coarsening of the hepatic echotexture suggests steatosis. Color
Doppler evaluation of the main portal vein is compatible with
hepatopetal flow.
IMPRESSION: Cholelithiasis with mild gallbladder wall thickening. Sonographer
reports a positive sonographic Murphy sign. Together, these imaging
features are highly suspicious for acute cholecystitis. If clinical
exam is equivocal, nuclear scintigraphy may prove helpful to assess
patency of the cystic duct.

These results will be called to the ordering clinician or
representative by the Radiologist Assistant, and communication
documented in the PACS or zVision Dashboard.
